# Patient Record
Sex: Male | Born: 1960 | Hispanic: Yes | Marital: Married | Smoking: Former smoker
Health system: Southern US, Community
[De-identification: ages and names within clinical notes are randomized; demographics above are authoritative.]

## PROBLEM LIST (undated history)

## (undated) DIAGNOSIS — G510 Bell's palsy: Secondary | ICD-10-CM

## (undated) DIAGNOSIS — K219 Gastro-esophageal reflux disease without esophagitis: Secondary | ICD-10-CM

## (undated) HISTORY — DX: Gastro-esophageal reflux disease without esophagitis: K21.9

## (undated) HISTORY — PX: OTHER SURGICAL HISTORY: SHX169

---

## 2012-01-18 ENCOUNTER — Encounter: Payer: Self-pay | Admitting: Internal Medicine

## 2012-02-15 ENCOUNTER — Ambulatory Visit (AMBULATORY_SURGERY_CENTER): Payer: BC Managed Care – PPO | Admitting: *Deleted

## 2012-02-15 VITALS — Ht 64.0 in | Wt 170.6 lb

## 2012-02-15 DIAGNOSIS — Z1211 Encounter for screening for malignant neoplasm of colon: Secondary | ICD-10-CM

## 2012-02-15 MED ORDER — MOVIPREP 100 G PO SOLR: ORAL | Status: DC

## 2012-03-03 ENCOUNTER — Ambulatory Visit (AMBULATORY_SURGERY_CENTER): Payer: BC Managed Care – PPO | Admitting: Internal Medicine

## 2012-03-03 ENCOUNTER — Encounter: Payer: Self-pay | Admitting: Internal Medicine

## 2012-03-03 VITALS — BP 108/71 | HR 57 | Temp 96.7°F | Resp 16 | Ht 64.0 in | Wt 170.0 lb

## 2012-03-03 DIAGNOSIS — K5730 Diverticulosis of large intestine without perforation or abscess without bleeding: Secondary | ICD-10-CM

## 2012-03-03 DIAGNOSIS — Z1211 Encounter for screening for malignant neoplasm of colon: Secondary | ICD-10-CM

## 2012-03-03 MED ORDER — SODIUM CHLORIDE 0.9 % IV SOLN: 500.0000 mL | INTRAVENOUS | Status: DC

## 2012-03-03 NOTE — Patient Instructions (Addendum)
YOU HAD AN ENDOSCOPIC PROCEDURE TODAY AT THE [facility]: Refer to the procedure report that was given to you for any specific questions about what was found during the examination.  If the procedure report does not answer your questions, please call your gastroenterologist to clarify.  If you requested that your care partner not be given the details of your procedure findings, then the procedure report has been included in a sealed envelope for you to review at your convenience later.  YOU SHOULD EXPECT: Some feelings of bloating in the abdomen. Passage of more gas than usual.  Walking can help get rid of the air that was put into your GI tract during the procedure and reduce the bloating. If you had a lower endoscopy (such as a colonoscopy or flexible sigmoidoscopy) you may notice spotting of blood in your stool or on the toilet paper. If you underwent a bowel prep for your procedure, then you may not have a normal bowel movement for a few days.  DIET: Your first meal following the procedure should be a light meal and then it is ok to progress to your normal diet.  A half-sandwich or bowl of soup is an example of a good first meal.  Heavy or fried foods are harder to digest and may make you feel nauseous or bloated.  Likewise meals heavy in dairy and vegetables can cause extra gas to form and this can also increase the bloating.  Drink plenty of fluids but you should avoid alcoholic beverages for 24 hours.  ACTIVITY: Your care partner should take you home directly after the procedure.  You should plan to take it easy, moving slowly for the rest of the day.  You can resume normal activity the day after the procedure however you should NOT DRIVE or use heavy machinery for 24 hours (because of the sedation medicines used during the test).    SYMPTOMS TO REPORT IMMEDIATELY: A gastroenterologist can be reached at any hour.  During normal business hours, 8:30 AM to 5:00 PM Monday through Friday,  call [phone].  After hours and on weekends, please call the GI answering service at [phone] who will take a message and have the physician on call contact you.   Following lower endoscopy (colonoscopy or flexible sigmoidoscopy):  Excessive amounts of blood in the stool  Significant tenderness or worsening of abdominal pains  Swelling of the abdomen that is new, acute  Fever of 100F or higher  FOLLOW UP: If any biopsies were taken you will be contacted by phone or by letter within the next 1-3 weeks.  Call your gastroenterologist if you have not heard about the biopsies in 3 weeks.  Our staff will call the home number listed on your records the next business day following your procedure to check on you and address any questions or concerns that you may have at that time regarding the information given to you following your procedure. This is a courtesy call and so if there is no answer at the home number and we have not heard from you through the emergency physician on call, we will assume that you have returned to your regular daily activities without incident.  SIGNATURES/CONFIDENTIALITY: You and/or your care partner have signed paperwork which will be entered into your electronic medical record.  These signatures attest to the fact that that the information above on your After Visit Summary has been reviewed and is understood.  Full responsibility of the confidentiality of this   discharge information lies with you and/or your care-partner.   Diverticulosis-handout given  High Fiber Diet-handout given  Repeat colonoscopy in 10 years  Diverticulosis (Diverticulosis)  La diverticulosis es una enfermedad frecuente que aparece cuando se forman pequeas bolsas (divertculos) en las paredes del colon. El riesgo aumenta con la edad. Ocurre con ms frecuencia en aquellas personas que se alimentan con una dieta pobre en fibras. La [employer] de los individuos que sufren este trastorno no  tiene sntomas. Las personas que tienen sntomas generalmente experimentan dolor abdominal, constipacin o heces flojas (diarrea). INSTRUCCIONES PARA EL CUIDADO DOMICILIARIO  Aumento en la cantidad de fibra en la dieta, segn las indicaciones del mdico o nutricionista. Esto puede reducir los sntomas de diverticulosis.  El mdico podr indicarle que tome un suplemento dietario con fibras.  Beba entre 6 y 8 vasos de agua por da para Associate Professor.  Trate de no hacer fuerza al mover el intestino.  El mdico podr recomendarle que no comer nueces y semillas para evitar complicaciones, aunque an no se conoce si esto produce algn beneficio.  Solo tome medicamentos que se pueden comprar sin receta o recetados para Chief Technology Officer, Dentist o fiebre, como le indica el mdico. LOS ALIMENTOS CON ALTO CONTENIDO DE FIBRA SON:  Frutas: Luna Kitchens, durazno, pera, mandarina, pasas de uva, ciruelas.  Vegetales: Repollitos de Bruselas, esprragos, brcoli, calabaza, zanahoria, coliflor, lechuga romana, espinaca, tomates, zapallo, zucchini.  Vegetales que contienen hidratos de carbono: Porotos, frijoles, habas, arvejas partidas, lentejas, patatas (con piel).  Granos: Pan de salvado, arroz marrn, copos de cereal, harina de avena, arroz blanco, bollitos de salvado. SOLICITE ATENCIN MDICA INMEDIATAMENTE SI:  El dolor o la hinchazn Duncan.  La temperatura oral le sube a ms de 102 F (38.9 C) y no puede bajarla con medicamentos.  Los vmitos o la materia fecal contienen sangre o son negros. Document Released: 01/01/2008 Document Revised: 04/12/2011  [facility] Patient Information 2013 [place], [place].  Dieta con alto contenido de fibra  (High [employer] Diet) La fibra se encuentra en frutas, verduras y granos. Una dieta con alto contenido en fibras se favorece con la adicin de ms granos enteros, legumbres, frutas y verduras en su dieta. La cantidad recomendada de fibra para los hombres adultos es  de 38 g por da. Para las mujeres adultas es de 25 g por da. Las [employer] y las que amamantan deben consumir 28 gramos de fibra por Futures trader. Si usted tiene un problema digestivo o intestinal, consulte a su mdico antes de la adicin de alimentos ricos en fibra a su dieta. Coma una variedad de alimentos ricos en fibra en lugar de slo unos pocos.  OBJETIVO   Aumentar la masa fecal.  Tener deposiciones ms regulares para evitar el estreimiento.  Reducir el colesterol.  Para evitar comer en exceso. CUANDO SE UTILIZA ESTA DIETA?   En caso de estreimiento y hemorroides.  En caso de diverticulosis no complicada (enfermedad intestinal) y en el sndrome del colon irritable.  Si necesita ayuda para el control de [place].  Si desea mejorar su dieta como medida de proteccin contra la aterosclerosis, la diabetes y Management consultant. FUENTES DE FIBRA   Panes y cereales integrales.  Frutas, como las [place], [place], pltanos, fresas, Environmental manager y peras.  Verduras, como guisantes, zanahorias, batatas, remolachas, brcoli, repollo, espinacas y alcauciles.  Legumbres, las arvejas, soja, lentejas.  Almendras. CONTENIDO DE FIBRA DE LOS ALIMENTOS  Almidones y granos / Fibra Diettica (g)   Cheerios, 1 taza / 3 g  Corn Flakes, 1 taza / 0,7 g  Arroz inflado, 1  tazas / 0,3 g  Harina de avena instantnea (cocida),  taza / 2 g  Cereal de trigo escarchado, 1 taza / 5,1 g  Arroz marrn grano largo (cocido), 1 taza / 3,5 g  Arroz blanco grano largo (cocido), 1 taza / 0,6 g  Macarrones enriquecidos (cocidos), 1 taza / 2,5 g Legumbres / Fibra Diettica (g)   Frijoles cocidos (enlatados, crudos o vegetarianos),  taza / 5,2 g  Frijoles (enlatados),  taza / 6,8 g  Frijoles pintos (cocidos),  taza / 5,5 g Panes y Gaffer / Media planner (g)   Galletas de graham o miel, 2 plazas / 0,7 g  Galletitas saladas, 3 unidades / 0,3 g  Pretzels salados comunes, 10 pedazos / 1,8 g  Pan  integral, 1 rebanada / 1,9 g  Pan blanco, 1 rebanada / 0,7 g  Pan con pasas, 1 rebanada / 1,2 g  Bagel 3 oz / 2 g  Tortilla de harina, 1 oz / 0.9 g  Tortilla de maz, 1 pequea / 1,5 g  Pan de amburguesa o hot dog, 1 pequeo / 0,9 g Frutas / Fibra Diettica (g)   Manzana con piel, 1 mediana / 4,4 g  Pur de [employer],  taza / 1,5 g  Pltano,  mediano / 1,5 g  Uvas, 10 uvas / 0,4 g  Naranja, 1 pequea / 2,3 g  Pasas, 1,5 oz / 1.6 g  Meln, 1 taza / 1,4 g Vegetales / Fibra Diettica (g)   Judas verdes (en conserva),  taza / 1,3 g  Zanahorias (cocido),  taza / 2,3 g  Broccoli (cocido),  taza / 2,8 g  Guisantes (cocidos),  taza / 4,4 g  Pur de papas,  taza / 1,6 g  Lechuga, 1 taza / 0,5 g  Maz (en lata),  taza / 1,6 g  Tomate,  taza / 1,1 g 1 cup / 3 g. Document Released: 01/18/2005 Document Revised: 07/20/2011 [facility] Patient Information 2013 [place], [place].

## 2012-03-03 NOTE — Progress Notes (Signed)
0844 In Pacu, vsss, bbs=clear.

## 2012-03-03 NOTE — Op Note (Signed)
[facility] [address] [place], [address]   COLONOSCOPY PROCEDURE REPORT  PATIENT: Jay Wyatt, Jay Wyatt  MR#: [id number] BIRTHDATE: [birth year] , 51  yrs. old GENDER: Male ENDOSCOPIST: Roxy Cedar, MD REFERRED XB:JYNWGN Christell Constant, M.D. PROCEDURE DATE:  03/03/2012 PROCEDURE:   Colonoscopy, screening ASA CLASS:   Class I INDICATIONS:Average risk patient for colon cancer. MEDICATIONS: MAC sedation, administered by CRNA and propofol (Diprivan) 200mg  IV  DESCRIPTION OF PROCEDURE:   After the risks benefits and alternatives of the procedure were thoroughly explained, informed consent was obtained.  A digital rectal exam revealed no abnormalities of the rectum.   The LB CF-[id number]  endoscope was introduced through the anus and advanced to the cecum, which was identified by both the appendix and ileocecal valve. No adverse events experienced.   The quality of the prep was excellent, using MoviPrep  The instrument was then slowly withdrawn as the colon was fully examined.      COLON FINDINGS: Moderate diverticulosis was noted in the ascending colon and sigmoid colon.   The colon was otherwise normal.  There was no inflammation, polyps or cancers.  Retroflexed views revealed no abnormalities. The time to cecum=2 minutes 34 seconds. Withdrawal time=8 minutes 18 seconds.  The scope was withdrawn and the procedure completed.  COMPLICATIONS: There were no complications.  ENDOSCOPIC IMPRESSION: 1.   Moderate diverticulosis was noted in the ascending colon and sigmoid colon 2.   The colon was otherwise normal  RECOMMENDATIONS: 1. Continue current colorectal screening recommendations for "routine risk" patients with a repeat colonoscopy in 10 years.   eSigned:  Roxy Cedar, MD 03/03/2012 8:39 AM   cc: Rudi Heap, MD and The Patient

## 2012-03-03 NOTE — Progress Notes (Signed)
Patient did not experience any of the following events: a burn prior to discharge; a fall within the facility; wrong site/side/patient/procedure/implant event; or a hospital transfer or hospital admission upon discharge from the facility. (G8907) Patient did not have preoperative order for IV antibiotic SSI prophylaxis. (G8918)  

## 2012-03-06 ENCOUNTER — Telehealth: Payer: Self-pay

## 2012-03-06 NOTE — Telephone Encounter (Signed)
  Follow up Call-  Call back number 03/03/2012  Post procedure Call Back phone  # [phone]  Permission to leave phone message Yes     Patient questions:  Do you have a fever, pain , or abdominal swelling? no Pain Score  0 *  Have you tolerated food without any problems? yes  Have you been able to return to your normal activities? yes  Do you have any questions about your discharge instructions: Diet   no Medications  no Follow up visit  no  Do you have questions or concerns about your Care? no  Actions: * If pain score is 4 or above: No action needed, pain <4.

## 2013-04-30 ENCOUNTER — Emergency Department (HOSPITAL_COMMUNITY)
Admission: EM | Admit: 2013-04-30 | Discharge: 2013-04-30 | Disposition: A | Discharge status: Home / Self Care | Payer: BC Managed Care – PPO | Attending: Emergency Medicine | Admitting: Emergency Medicine

## 2013-04-30 ENCOUNTER — Encounter (HOSPITAL_COMMUNITY): Payer: Self-pay | Admitting: Emergency Medicine

## 2013-04-30 DIAGNOSIS — K219 Gastro-esophageal reflux disease without esophagitis: Secondary | ICD-10-CM | POA: Insufficient documentation

## 2013-04-30 DIAGNOSIS — K029 Dental caries, unspecified: Secondary | ICD-10-CM | POA: Insufficient documentation

## 2013-04-30 DIAGNOSIS — Z87891 Personal history of nicotine dependence: Secondary | ICD-10-CM | POA: Insufficient documentation

## 2013-04-30 DIAGNOSIS — G510 Bell's palsy: Secondary | ICD-10-CM | POA: Insufficient documentation

## 2013-04-30 DIAGNOSIS — Z79899 Other long term (current) drug therapy: Secondary | ICD-10-CM | POA: Insufficient documentation

## 2013-04-30 DIAGNOSIS — R5383 Other fatigue: Secondary | ICD-10-CM

## 2013-04-30 DIAGNOSIS — R5381 Other malaise: Secondary | ICD-10-CM | POA: Insufficient documentation

## 2013-04-30 MED ORDER — ERYTHROMYCIN 5 MG/GM OP OINT
TOPICAL_OINTMENT | Freq: Every day | OPHTHALMIC | Status: DC
  Administered 2013-04-30: 13:00:00 via OPHTHALMIC
  Filled 2013-04-30: qty 1

## 2013-04-30 MED ORDER — PENICILLIN V POTASSIUM 250 MG PO TABS: 250.0000 mg | ORAL_TABLET | Freq: Four times a day (QID) | ORAL | Status: AC

## 2013-04-30 NOTE — ED Notes (Signed)
Pt updated on delay; encouraged to stay.

## 2013-04-30 NOTE — ED Notes (Signed)
Pt has left side facial droop that began last Sunday. He received treatment at another facility last Tuesday and was diagnosed with Bell's Palsy. He has begun a round of medications to treat the symptoms but has noticed no improvement so he came in to follow up.

## 2013-04-30 NOTE — ED Provider Notes (Addendum)
CSN: [id number]     Arrival date & time 04/30/13  1002 History   First MD Initiated Contact with Patient 04/30/13 1212     Chief Complaint  Patient presents with  . Facial Droop     (Consider location/radiation/quality/duration/timing/severity/associated sxs/prior Treatment) HPI Comments: [facility] after 2 days of left-sided facial droop. At that time he had a CAT scan of his head and blood work done which was all normal and he was diagnosed with Bell's palsy. He returned today because the symptoms are not improving despite taking medicine for the last 6 days. He also saw an eye doctor and was given eyedrops to keep his eye moist. He denies any extremity numbness or weakness. No speech problems but he does complain of food falling out of his mouth when he is trying to eat.  Patient is a 53 y.o. male presenting with weakness. The history is provided by the patient.  Weakness This is a new problem. Episode onset: 1 week. The problem occurs constantly. The problem has not changed since onset.Pertinent negatives include no headaches. Associated symptoms comments: Left sided facial droop. Nothing aggravates the symptoms. Nothing relieves the symptoms. Treatments tried: Valacyclovir and prednisone. The treatment provided no relief.    Past Medical History  Diagnosis Date  . GERD (gastroesophageal reflux disease)    Past Surgical History  Procedure Laterality Date  . No prior surgery     Family History  Problem Relation Age of Onset  . Colon cancer Neg Hx   . Stomach cancer Neg Hx   . Diabetes Mother   . Diabetes Father    History  Substance Use Topics  . Smoking status: Former Games developermoker  . Smokeless tobacco: Never Used  . Alcohol Use: 3.6 oz/week    6 Cans of beer per week    Review of Systems  Neurological: Positive for facial asymmetry and weakness. Negative for speech difficulty and headaches.  All other systems reviewed and are  negative.      Allergies  Review of patient's allergies indicates no known allergies.  Home Medications   Current Outpatient Rx  Name  Route  Sig  Dispense  Refill  . omeprazole (PRILOSEC) 20 MG capsule   Oral   Take 20 mg by mouth daily.          BP 129/79  Pulse 65  Temp(Src) 97.9 F (36.6 C) (Oral)  Resp 20  Ht 5\' 4"  (1.626 m)  Wt 181 lb 1.6 oz (82.146 kg)  BMI 31.07 kg/m2 Physical Exam  Nursing note and vitals reviewed. Constitutional: He is oriented to person, place, and time. He appears well-developed and well-nourished. No distress.  HENT:  Head: Normocephalic and atraumatic.  Mouth/Throat: Oropharynx is clear and moist. Dental caries present.    Eyes: Conjunctivae and EOM are normal. Pupils are equal, round, and reactive to light.  Neck: Normal range of motion. Neck supple.  Cardiovascular: Normal rate, regular rhythm and intact distal pulses.   No murmur heard. Pulmonary/Chest: Effort normal and breath sounds normal. No respiratory distress. He has no wheezes. He has no rales.  Abdominal: Soft. He exhibits no distension. There is no tenderness. There is no rebound and no guarding.  Musculoskeletal: Normal range of motion. He exhibits no edema and no tenderness.  Neurological: He is alert and oriented to person, place, and time. A cranial nerve deficit is present.  Left facial droop without forehead sparing.  Unable to raise eyebrow.  Mild decreased sensation on the left side of face compared to right.  5/5 motor strength in all 4 extremities. Normal sensation in all 4 extremities. Speech normal. No visual field cut  Skin: Skin is warm and dry. No rash noted. No erythema.  Psychiatric: He has a normal mood and affect. His behavior is normal.    ED Course  Procedures (including critical care time) Labs Review Labs Reviewed - No data to display Imaging Review No results found.   EKG Interpretation   Date/Time:  Monday April 30 2013 10:15:49  EDT Ventricular Rate:  63 PR Interval:  158 QRS Duration: 100 QT Interval:  400 QTC Calculation: 409 R Axis:   15 Text Interpretation:  Normal sinus rhythm Incomplete right bundle branch  block Minimal voltage criteria for LVH, may be normal variant Nonspecific  ST abnormality No previous tracing Confirmed by Anitra Lauth  MD, Alphonzo Lemmings  [phone]) on 04/30/2013 12:11:04 PM      MDM   Final diagnoses:  Bell's palsy  Dental caries    Patient here with evidence of S. palsy. He was diagnosed with Bell's palsy on [facility] he was given valacyclovir and prednisone which he has been taking aspirin scribe. [facility] he had a CT of his head MRI were done which were all within normal limits. Patient is having persistent symptoms but no new symptoms of extremity weakness, visual or or speech problems. It still appears to be Bell's palsy. Discussed with patient the expected time frame of symptoms and the need for followup. He already has eyedrops but does not have the staff to place at night. He is wearing protective eye gear because he is unable to completely close his lid.  Also patient has tooth decay and is scheduled to have a dental extraction on April 11. No apparent sign of gross infection at this time however will get a prescription for an antibiotic that he can start a week prior to his tooth being pulled.    Gwyneth Sprout, MD 04/30/13 1246  Gwyneth Sprout, MD 04/30/13 1247  Gwyneth Sprout, MD 04/30/13 1256

## 2013-04-30 NOTE — ED Notes (Signed)
[facility] and dx with Bells Palsy on the 24th; has not gotten better, droop seems to have gotten worse. Here bc worsening droop he thinks due to tooth pain. Recently saw eye doctor and was given drops for dry eye.

## 2013-12-22 ENCOUNTER — Encounter (HOSPITAL_COMMUNITY): Payer: Self-pay | Admitting: Emergency Medicine

## 2013-12-22 ENCOUNTER — Emergency Department (HOSPITAL_COMMUNITY)
Admission: EM | Admit: 2013-12-22 | Discharge: 2013-12-22 | Disposition: A | Discharge status: Home / Self Care | Payer: BC Managed Care – PPO | Attending: Emergency Medicine | Admitting: Emergency Medicine

## 2013-12-22 DIAGNOSIS — M5431 Sciatica, right side: Secondary | ICD-10-CM | POA: Insufficient documentation

## 2013-12-22 DIAGNOSIS — M722 Plantar fascial fibromatosis: Secondary | ICD-10-CM | POA: Insufficient documentation

## 2013-12-22 DIAGNOSIS — Z87891 Personal history of nicotine dependence: Secondary | ICD-10-CM | POA: Insufficient documentation

## 2013-12-22 DIAGNOSIS — Z8719 Personal history of other diseases of the digestive system: Secondary | ICD-10-CM | POA: Insufficient documentation

## 2013-12-22 LAB — CBG MONITORING, ED: Glucose-Capillary: 86 mg/dL (ref 70–99)

## 2013-12-22 MED ORDER — CYCLOBENZAPRINE HCL 10 MG PO TABS: 10.0000 mg | ORAL_TABLET | Freq: Three times a day (TID) | ORAL | Status: DC | PRN

## 2013-12-22 MED ORDER — PREDNISONE 10 MG PO TABS: ORAL_TABLET | ORAL | Status: DC

## 2013-12-22 MED ORDER — OXYCODONE-ACETAMINOPHEN 5-325 MG PO TABS: 1.0000 | ORAL_TABLET | ORAL | Status: AC | PRN

## 2013-12-22 NOTE — Progress Notes (Signed)
ED/CM noted patient did not have health insurance and/or PCP listed in the computer.  Patient was given the [place] resources handout with information on the clinics, food pantries, and the handout for new health insurance sign-up.  Also provided drug discount card.  Patient expressed appreciation for information received.   

## 2013-12-22 NOTE — Discharge Instructions (Signed)
Plantar Fasciitis Plantar fasciitis is a common condition that causes foot pain. It is soreness (inflammation) of the band of tough fibrous tissue on the bottom of the foot that runs from the heel bone (calcaneus) to the ball of the foot. The cause of this soreness may be from excessive standing, poor fitting shoes, running on hard surfaces, being overweight, having an abnormal walk, or overuse (this is common in runners) of the painful foot or feet. It is also common in aerobic exercise dancers and ballet dancers. SYMPTOMS  Most people with plantar fasciitis complain of:  Severe pain in the morning on the bottom of their foot especially when taking the first steps out of bed. This pain recedes after a few minutes of walking.  Severe pain is experienced also during walking following a long period of inactivity.  Pain is worse when walking barefoot or up stairs DIAGNOSIS   Your caregiver will diagnose this condition by examining and feeling your foot.  Special tests such as X-rays of your foot, are usually not needed. PREVENTION   Consult a sports medicine professional before beginning a new exercise program.  Walking programs offer a good workout. With walking there is a lower chance of overuse injuries common to runners. There is less impact and less jarring of the joints.  Begin all new exercise programs slowly. If problems or pain develop, decrease the amount of time or distance until you are at a comfortable level.  Wear good shoes and replace them regularly.  Stretch your foot and the heel cords at the back of the ankle (Achilles tendon) both before and after exercise.  Run or exercise on even surfaces that are not hard. For example, asphalt is better than pavement.  Do not run barefoot on hard surfaces.  If using a treadmill, vary the incline.  Do not continue to workout if you have foot or joint problems. Seek professional help if they do not improve. HOME CARE INSTRUCTIONS     Avoid activities that cause you pain until you recover.  Use ice or cold packs on the problem or painful areas after working out.  Only take over-the-counter or prescription medicines for pain, discomfort, or fever as directed by your caregiver.  Soft shoe inserts or athletic shoes with air or gel sole cushions may be helpful.  If problems continue or become more severe, consult a sports medicine caregiver or your own health care provider. Cortisone is a potent anti-inflammatory medication that may be injected into the painful area. You can discuss this treatment with your caregiver. MAKE SURE YOU:   Understand these instructions.  Will watch your condition.  Will get help right away if you are not doing well or get worse. Document Released: 10/13/2000 Document Revised: 04/12/2011 Document Reviewed: 12/13/2007 [facility] Patient Information 2015 [place], [place]. This information is not intended to replace advice given to you by your health care provider. Make sure you discuss any questions you have with your health care provider.  Sciatica Sciatica is pain, weakness, numbness, or tingling along your sciatic nerve. The nerve starts in the lower back and runs down the back of each leg. Nerve damage or certain conditions pinch or put pressure on the sciatic nerve. This causes the pain, weakness, and other discomforts of sciatica. HOME CARE   Only take medicine as told by your doctor.  Apply ice to the affected area for 20 minutes. Do this 3-4 times a day for the first 48-72 hours. Then try heat in the  same way.  Exercise, stretch, or do your usual activities if these do not make your pain worse.  Go to physical therapy as told by your doctor.  Keep all doctor visits as told.  Do not wear high heels or shoes that are not supportive.  Get a firm mattress if your mattress is too soft to lessen pain and discomfort. GET HELP RIGHT AWAY IF:   You cannot control when you poop (bowel  movement) or pee (urinate).  You have more weakness in your lower back, lower belly (pelvis), butt (buttocks), or legs.  You have redness or puffiness (swelling) of your back.  You have a burning feeling when you pee.  You have pain that gets worse when you lie down.  You have pain that wakes you from your sleep.  Your pain is worse than past pain.  Your pain lasts longer than 4 weeks.  You are suddenly losing weight without reason. MAKE SURE YOU:   Understand these instructions.  Will watch this condition.  Will get help right away if you are not doing well or get worse. Document Released: 10/28/2007 Document Revised: 07/20/2011 Document Reviewed: 05/30/2011 [facility] Patient Information 2015 [place], [place]. This information is not intended to replace advice given to you by your health care provider. Make sure you discuss any questions you have with your health care provider.

## 2013-12-22 NOTE — ED Provider Notes (Signed)
CSN: [id number]     Arrival date & time 12/22/13  0945 History   First MD Initiated Contact with Patient 12/22/13 1019     Chief Complaint  Patient presents with  . Back Pain     (Consider location/radiation/quality/duration/timing/severity/associated sxs/prior Treatment) HPI  Jay Wyatt is a 53 y.o. male who presents to the Emergency Department complaining of right sided low back pain for 2 weeks.  He describes the pain as sharp and burning from his right low back and radiates into the right buttock and down to the level of the knee.  Pain is worse with movement and twisting and improves at rest.  He also reports pain and tingling to his hind foot and ankle for 3-4 days.  Pain is worse when first putting weight to his foot the morning.  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   He denies abd pain, incontinence of bladder or bowel, dysuria, saddle anesthesia's, numbness or weakness of the LE's.     Past Medical History  Diagnosis Date  . GERD (gastroesophageal reflux disease)    Past Surgical History  Procedure Laterality Date  . No prior surgery     Family History  Problem Relation Age of Onset  . Colon cancer Neg Hx   . Stomach cancer Neg Hx   . Diabetes Mother   . Diabetes Father    History  Substance Use Topics  . Smoking status: Former Games developermoker  . Smokeless tobacco: Never Used  . Alcohol Use: 3.6 oz/week    6 Cans of beer per week    Review of Systems  Constitutional: Negative for fever.  Respiratory: Negative for shortness of breath.   Gastrointestinal: Negative for vomiting, abdominal pain and constipation.  Genitourinary: Negative for dysuria, hematuria, flank pain, decreased urine volume and difficulty urinating.       Low back pain  Musculoskeletal: Positive for back pain. Negative for joint swelling, neck pain and neck stiffness.  Skin: Negative for rash.  Neurological: Negative  for dizziness, weakness and numbness.  All other systems reviewed and are negative.     Allergies  Review of patient's allergies indicates no known allergies.  Home Medications   Prior to Admission medications   Medication Sig Start Date End Date Taking? Authorizing Provider  acetaminophen (TYLENOL) 500 MG tablet Take 1,000 mg by mouth every 6 (six) hours as needed for moderate pain.   Yes Historical Provider, MD   BP 123/83 mmHg  Pulse 62  Temp(Src) 98.1 F (36.7 C) (Oral)  Resp 18  Ht 5\' 6"  (1.676 m)  Wt 184 lb (83.462 kg)  BMI 29.71 kg/m2  SpO2 96% Physical Exam  Constitutional: He is oriented to person, place, and time. He appears well-developed and well-nourished. No distress.  HENT:  Head: Normocephalic and atraumatic.  Neck: Normal range of motion. Neck supple.  Cardiovascular: Normal rate, regular rhythm, normal heart sounds and intact distal pulses.   No murmur heard. Pulmonary/Chest: Effort normal and breath sounds normal. No respiratory distress. He exhibits no tenderness.  Abdominal: Soft. He exhibits no distension. There is no tenderness. There is no rebound and no guarding.  Musculoskeletal: He exhibits tenderness. He exhibits no edema.       Lumbar back: He exhibits tenderness and pain. He exhibits normal range of motion, no swelling, no deformity, no laceration and normal pulse.  ttp of the right lumbar paraspinal muscles and  SI joint.  No spinal tenderness.  DP pulses are brisk and symmetrical.  Distal sensation intact.  Hip Flexors/Extensors are intact.  Pt has 5/5 strength against resistance of bilateral lower extremities.  Tenderness of the right instep of the foot.  No edema or erythema   Neurological: He is alert and oriented to person, place, and time. He has normal strength. No sensory deficit. He exhibits normal muscle tone. Coordination and gait normal.  Reflex Scores:      Patellar reflexes are 2+ on the right side and 2+ on the left side.       Achilles reflexes are 2+ on the right side and 2+ on the left side. Skin: Skin is warm and dry. No rash noted.  Nursing note and vitals reviewed.   ED Course  Procedures (including critical care time) Labs Review Labs Reviewed  CBG MONITORING, ED    Imaging Review No results found.   EKG Interpretation None      MDM   Final diagnoses:  Sciatica, right  Plantar fasciitis of right foot    Pt is ambulatory with steady giat, no focal neuro deficits.   No concerning sx's for emergent neurological or infectious process.  Sx's likely related to sciatica and probable plantar fasciitis, secondary to standing on ladders.  Pt agrees to symptomatic tx with prednisone, percocet, and flexeril.  He agrees to PMD f/u or return here if the sx's worsen.  He appears stable for d/c.      Zalika Tieszen L. Trisha Mangleriplett, PA-C 12/23/13 [id number]  Shon Batonourtney F Horton, MD 12/23/13 1109

## 2013-12-22 NOTE — ED Notes (Signed)
Pt reports right low back pain that radiates down his right leg, described as burning pain

## 2014-02-04 ENCOUNTER — Encounter (HOSPITAL_COMMUNITY): Payer: Self-pay

## 2014-02-04 ENCOUNTER — Emergency Department (HOSPITAL_COMMUNITY)
Admission: EM | Admit: 2014-02-04 | Discharge: 2014-02-04 | Disposition: A | Discharge status: Home / Self Care | Payer: Self-pay | Attending: Emergency Medicine | Admitting: Emergency Medicine

## 2014-02-04 DIAGNOSIS — G510 Bell's palsy: Secondary | ICD-10-CM | POA: Insufficient documentation

## 2014-02-04 DIAGNOSIS — Z7952 Long term (current) use of systemic steroids: Secondary | ICD-10-CM | POA: Insufficient documentation

## 2014-02-04 DIAGNOSIS — Z79899 Other long term (current) drug therapy: Secondary | ICD-10-CM | POA: Insufficient documentation

## 2014-02-04 DIAGNOSIS — Z8719 Personal history of other diseases of the digestive system: Secondary | ICD-10-CM | POA: Insufficient documentation

## 2014-02-04 DIAGNOSIS — Z87891 Personal history of nicotine dependence: Secondary | ICD-10-CM | POA: Insufficient documentation

## 2014-02-04 HISTORY — DX: Bell's palsy: G51.0

## 2014-02-04 MED ORDER — VALACYCLOVIR HCL 1 G PO TABS: 1000.0000 mg | ORAL_TABLET | Freq: Three times a day (TID) | ORAL | Status: AC

## 2014-02-04 MED ORDER — PREDNISONE 20 MG PO TABS: 60.0000 mg | ORAL_TABLET | Freq: Every day | ORAL | Status: AC

## 2014-02-04 NOTE — Discharge Instructions (Signed)
Bell's Palsy °Bell's palsy is a condition in which the muscles on one side of the face cannot move (paralysis). This is because the nerves in the face are paralyzed. It is most often thought to be caused by a virus. The virus causes swelling of the nerve that controls movement on one side of the face. The nerve travels through a tight space surrounded by bone. When the nerve swells, it can be compressed by the bone. This results in damage to the protective covering around the nerve. This damage interferes with how the nerve communicates with the muscles of the face. As a result, it can cause weakness or paralysis of the facial muscles.  °Injury (trauma), tumor, and surgery may cause Bell's palsy, but most of the time the cause is unknown. It is a relatively common condition. It starts suddenly (abrupt onset) with the paralysis usually ending within 2 days. Bell's palsy is not dangerous. But because the eye does not close properly, you may need care to keep the eye from getting dry. This can include splinting (to keep the eye shut) or moistening with artificial tears. Bell's palsy very seldom occurs on both sides of the face at the same time. °SYMPTOMS  °· Eyebrow sagging. °· Drooping of the eyelid and corner of the mouth. °· Inability to close one eye. °· Loss of taste on the front of the tongue. °· Sensitivity to loud noises. °TREATMENT  °The treatment is usually non-surgical. If the patient is seen within the first 24 to 48 hours, a short course of steroids may be prescribed, in an attempt to shorten the length of the condition. Antiviral medicines may also be used with the steroids, but it is unclear if they are helpful.  °You will need to protect your eye, if you cannot close it. The cornea (clear covering over your eye) will become dry and can be damaged. Artificial tears can be used to keep your eye moist. Glasses or an eye patch should be worn to protect your eye. °PROGNOSIS  °Recovery is variable, ranging  from days to months. Although the problem usually goes away completely (about 80% of cases resolve), predicting the outcome is impossible. Most people improve within 3 weeks of when the symptoms began. Improvement may continue for 3 to 6 months. A small number of people have moderate to severe weakness that is permanent.  °HOME CARE INSTRUCTIONS  °· If your caregiver prescribed medication to reduce swelling in the nerve, use as directed. Do not stop taking the medication unless directed by your caregiver. °· Use moisturizing eye drops as needed to prevent drying of your eye, as directed by your caregiver. °· Protect your eye, as directed by your caregiver. °· Use facial massage and exercises, as directed by your caregiver. °· Perform your normal activities, and get your normal rest. °SEEK IMMEDIATE MEDICAL CARE IF:  °· There is pain, redness or irritation in the eye. °· You or your child has an oral temperature above 102° F (38.9° C), not controlled by medicine. °MAKE SURE YOU:  °· Understand these instructions. °· Will watch your condition. °· Will get help right away if you are not doing well or get worse. °Document Released: 01/18/2005 Document Revised: 04/12/2011 Document Reviewed: 04/27/2013 °Exit[employer]® Patient Information ©2015 [employer]. This information is not intended to replace advice given to you by your health care provider. Make sure you discuss any questions you have with your health care provider. ° °

## 2014-02-04 NOTE — Care Management Note (Signed)
ED/CM noted patient did not have health insurance and/or PCP listed in the computer.  Patient was given the [place] resource handout with information on the clinics, food pantries, and the handout for new health insurance sign-up. Pt was also given a Rx discount card. Patient expressed appreciation for information received. 

## 2014-02-04 NOTE — ED Notes (Signed)
EDP at bedside  

## 2014-02-04 NOTE — ED Provider Notes (Signed)
CSN: [id number]     Arrival date & time 02/04/14  1045 History   First MD Initiated Contact with Patient 02/04/14 1159     Chief Complaint  Patient presents with  . Facial Droop     (Consider location/radiation/quality/duration/timing/severity/associated sxs/prior Treatment) The history is provided by the patient.   patient presents with right-sided facial droop that began yesterday. Previous history of Bell's palsy around 7 months ago. States he will get yesterday with his face drooping. It is similar to what he had last time. States he has some mild ringing in his right ear. No other numbness or weakness. No headache. No confusion. No fevers. He states he just wants to be able go back to work.  Past Medical History  Diagnosis Date  . GERD (gastroesophageal reflux disease)   . Bell's palsy    Past Surgical History  Procedure Laterality Date  . No prior surgery     Family History  Problem Relation Age of Onset  . Colon cancer Neg Hx   . Stomach cancer Neg Hx   . Diabetes Mother   . Diabetes Father    History  Substance Use Topics  . Smoking status: Former Games developer  . Smokeless tobacco: Never Used  . Alcohol Use: 3.6 oz/week    6 Cans of beer per week     Comment: occ    Review of Systems  Constitutional: Negative for fatigue.  Respiratory: Negative for apnea.   Cardiovascular: Negative for chest pain.  Neurological: Positive for facial asymmetry. Negative for dizziness, syncope, weakness, numbness and headaches.  Psychiatric/Behavioral: Negative for confusion.      Allergies  Review of patient's allergies indicates no known allergies.  Home Medications   Prior to Admission medications   Medication Sig Start Date End Date Taking? Authorizing Provider  acetaminophen (TYLENOL) 500 MG tablet Take 1,000 mg by mouth every 6 (six) hours as needed for moderate pain.    Historical Provider, MD  cyclobenzaprine (FLEXERIL) 10 MG tablet Take 1 tablet (10 mg total) by mouth 3  (three) times daily as needed. Patient not taking: Reported on 02/04/2014 12/22/13   Tammy L. Triplett, PA-C  oxyCODONE-acetaminophen (PERCOCET/ROXICET) 5-325 MG per tablet Take 1 tablet by mouth every 4 (four) hours as needed. Patient not taking: Reported on 02/04/2014 12/22/13   Tammy L. Triplett, PA-C  predniSONE (DELTASONE) 20 MG tablet Take 3 tablets (60 mg total) by mouth daily. 02/04/14   Juliet Rude. Temisha Murley, MD  valACYclovir (VALTREX) 1000 MG tablet Take 1 tablet (1,000 mg total) by mouth 3 (three) times daily. 02/04/14 02/18/14  Juliet Rude. Martino Tompson, MD   BP 129/88 mmHg  Pulse 60  Temp(Src) 97.9 F (36.6 C) (Oral)  Resp 20  Ht  (1.626 m)  Wt 180 lb (81.647 kg)  BMI 30.88 kg/m2  SpO2 100% Physical Exam  Constitutional: He appears well-developed.  HENT:  Head: Normocephalic.  Right TM normal  Eyes: EOM are normal.  Neck: Neck supple.  Cardiovascular: Normal rate and regular rhythm.   Pulmonary/Chest: Effort normal.  Neurological: He is alert.  Moderate right-sided facial droop. Smile will not equalize even with effort. Some difficulty closing right eye. Minimal response from right eyebrow with attempted raising. Sensation intact.    ED Course  Procedures (including critical care time) Labs Review Labs Reviewed - No data to display  Imaging Review No results found.   EKG Interpretation None      MDM   Final diagnoses:  Bell's palsy  Patient with rather severe Bell's palsy. Began yesterday. Will treat with valacyclovir and steroids. Will discharge home.    Juliet Rude. Rubin Payor, MD 02/04/14 1233

## 2014-02-04 NOTE — ED Notes (Signed)
Pt reports r side of face numb and r sided facial droop since yesterday afternoon.  Reports was diagnosed with bells palsy 4 months ago and was given medication.  Pt says it got better then symptoms began last night.

## 2018-08-23 ENCOUNTER — Other Ambulatory Visit: Payer: Self-pay

## 2018-08-23 ENCOUNTER — Emergency Department (HOSPITAL_COMMUNITY)
Admission: EM | Admit: 2018-08-23 | Discharge: 2018-08-23 | Disposition: A | Discharge status: Home / Self Care | Payer: Self-pay | Attending: Emergency Medicine | Admitting: Emergency Medicine

## 2018-08-23 ENCOUNTER — Emergency Department (HOSPITAL_COMMUNITY): Payer: Self-pay

## 2018-08-23 ENCOUNTER — Encounter (HOSPITAL_COMMUNITY): Payer: Self-pay | Admitting: Emergency Medicine

## 2018-08-23 DIAGNOSIS — Z87891 Personal history of nicotine dependence: Secondary | ICD-10-CM | POA: Insufficient documentation

## 2018-08-23 DIAGNOSIS — M546 Pain in thoracic spine: Secondary | ICD-10-CM | POA: Insufficient documentation

## 2018-08-23 DIAGNOSIS — M549 Dorsalgia, unspecified: Secondary | ICD-10-CM

## 2018-08-23 MED ORDER — CYCLOBENZAPRINE HCL 10 MG PO TABS: 10.0000 mg | ORAL_TABLET | Freq: Three times a day (TID) | ORAL | 0 refills | Status: AC | PRN

## 2018-08-23 MED ORDER — NAPROXEN 500 MG PO TABS: 500.0000 mg | ORAL_TABLET | Freq: Two times a day (BID) | ORAL | 0 refills | Status: AC

## 2018-08-23 NOTE — Discharge Instructions (Signed)
Take the medication as directed.  Follow-up with your primary doctor for recheck.  Return to the ER for any worsening symptoms such as increasing pain, chest pain, or shortness of breath.

## 2018-08-23 NOTE — ED Triage Notes (Signed)
Patient reports upper back pain made worse with mvmt, especially when trying to turn in the bed or bend over. Works in [employer]. Denies chest pain, dizziness, nausea, or emesis.

## 2018-08-23 NOTE — ED Provider Notes (Signed)
[facility] EMERGENCY DEPARTMENT Provider Note   CSN: [id number] Arrival date & time: 08/23/18  1241     History   Chief Complaint Chief Complaint  Patient presents with  . Back Pain    HPI Jay Wyatt is a 58 y.o. male.     HPI   Jay Wyatt is a 58 y.o. male who presents to the Emergency Department complaining of upper back pain for 2 to 3 days.  He describes a throbbing pain to his upper back that mostly occurs with certain movements and when lying supine.  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  Pain does improve with over-the-counter pain relievers.  He denies chest pain, shortness of breath, pain, numbness, or weakness of his upper extremities, neck pain or headache.  He also complains of occasional cough, but denies fever, chills, sore throat or congestion.  Past Medical History:  Diagnosis Date  . Bell's palsy   . GERD (gastroesophageal reflux disease)     There are no active problems to display for this patient.   Past Surgical History:  Procedure Laterality Date  . no prior surgery          Home Medications    Prior to Admission medications   Medication Sig Start Date End Date Taking? Authorizing Provider  acetaminophen (TYLENOL) 500 MG tablet Take 1,000 mg by mouth every 6 (six) hours as needed for moderate pain.    [provider]  cyclobenzaprine (FLEXERIL) 10 MG tablet Take 1 tablet (10 mg total) by mouth 3 (three) times daily as needed. Patient not taking: Reported on 02/04/2014 12/22/13   Pauline Ausriplett, Kayin Kettering, PA-C  oxyCODONE-acetaminophen (PERCOCET/ROXICET) 5-325 MG per tablet Take 1 tablet by mouth every 4 (four) hours as needed. Patient not taking: Reported on 02/04/2014 12/22/13   Ivo Moga, PA-C  predniSONE (DELTASONE) 20 MG tablet Take 3 tablets (60 mg total) by mouth daily. 02/04/14   Benjiman CorePickering, Nathan, MD    Family History Family History  Problem Relation  Age of Onset  . Diabetes Mother   . Diabetes Father   . Colon cancer Neg Hx   . Stomach cancer Neg Hx     Social History Social History   Tobacco Use  . Smoking status: Former Games developermoker  . Smokeless tobacco: Never Used  Substance Use Topics  . Alcohol use: Yes    Alcohol/week: 6.0 standard drinks    Types: 6 Cans of beer per week    Comment: occ  . Drug use: No     Allergies   Patient has no known allergies.   Review of Systems Review of Systems  Constitutional: Negative for fever.  HENT: Negative for congestion and sore throat.   Eyes: Negative for visual disturbance.  Respiratory: Positive for cough. Negative for chest tightness and shortness of breath.   Cardiovascular: Negative for chest pain.  Gastrointestinal: Negative for abdominal pain, nausea and vomiting.  Genitourinary: Negative for decreased urine volume, difficulty urinating, dysuria and flank pain.  Musculoskeletal: Positive for back pain. Negative for joint swelling, neck pain and neck stiffness.  Skin: Negative for rash.  Neurological: Negative for dizziness, weakness, numbness and headaches.     Physical Exam Updated Vital Signs BP 123/76 (BP Location: Right Arm)   Pulse 71   Temp 97.9 F (36.6 C) (Oral)   Resp 18   SpO2 100%   Physical Exam Vitals signs and nursing note reviewed.  Constitutional:      Appearance: Normal appearance. He is not ill-appearing or toxic-appearing.  HENT:     Head: Atraumatic.     Mouth/Throat:     Mouth: Mucous membranes are moist.     Pharynx: Oropharynx is clear.  Neck:     Musculoskeletal: Normal range of motion. No muscular tenderness.  Cardiovascular:     Rate and Rhythm: Normal rate and regular rhythm.     Pulses: Normal pulses.  Pulmonary:     Effort: Pulmonary effort is normal.     Breath sounds: Normal breath sounds.  Chest:     Chest wall: No tenderness.  Musculoskeletal: Normal range of motion.     Thoracic back: He exhibits tenderness.        Back:     Comments: Midline tenderness of the upper thoracic spine and mild tenderness to the bilateral paraspinal muscles.  Pain is reproducible with palpation and range of motion of the arms.  Skin:    General: Skin is warm.     Capillary Refill: Capillary refill takes less than 2 seconds.     Findings: No erythema or rash.  Neurological:     General: No focal deficit present.     Mental Status: He is alert.     Sensory: Sensation is intact.     Motor: Motor function is intact. No weakness.     Coordination: Coordination is intact.     Comments: CN II-XII grossly intact  Psychiatric:        Mood and Affect: Mood normal.      ED Treatments / Results  Labs (all labs ordered are listed, but only abnormal results are displayed) Labs Reviewed - No data to display  EKG EKG Interpretation  Date/Time:  Wednesday August 23 2018 13:55:42 EDT Ventricular Rate:  59 PR Interval:    QRS Duration: 113 QT Interval:  411 QTC Calculation: 408 R Axis:   0 Text Interpretation:  Sinus rhythm Incomplete right bundle branch block Low voltage, precordial leads ST elevation suggests acute pericarditis Confirmed by Milton Ferguson [id number] on 08/23/2018 2:05:54 PM  EKG reviewed by Dr. Roderic Palau.  Similar to previous tracing.  Doubt pericarditis   Radiology Dg Chest 2 View  Result Date: 08/23/2018 CLINICAL DATA:  Upper back pain. EXAM: CHEST - 2 VIEW COMPARISON:  None. FINDINGS: Mild opacity in left base. The heart, hila, mediastinum, lungs, and pleura are otherwise unremarkable. No cause for back pain identified. IMPRESSION: Probable atelectasis in the left base. No other acute abnormalities are noted. Electronically Signed   By: Dorise Bullion III M.D   On: 08/23/2018 13:59    Procedures Procedures (including critical care time)  Medications Ordered in ED Medications - No data to display   Initial Impression / Assessment and Plan / ED Course  I have reviewed the triage vital signs and the  nursing notes.  Pertinent labs & imaging results that were available during my care of the patient were reviewed by me and considered in my medical decision making (see chart for details).        Patient with upper back pain, likely musculoskeletal.  Neurovascularly intact.  Pain is reproducible with palpation and movement.  No focal neuro deficits.  No concerning symptoms for PE or ACS.  Discussed with Dr. Roderic Palau.  Patient agrees to treatment plan and close outpatient follow-up.  Return precautions were discussed.  Final Clinical Impressions(s) / ED Diagnoses   Final diagnoses:  Upper back pain  ED Discharge Orders    None       Pauline Ausriplett, Eason Housman, Cordelia Poche-C 08/23/18 1427    Bethann BerkshireZammit, Joseph, MD 08/23/18 Silva [id number]

## 2018-11-21 ENCOUNTER — Emergency Department (HOSPITAL_COMMUNITY)
Admission: EM | Admit: 2018-11-21 | Discharge: 2018-11-21 | Disposition: A | Discharge status: Home / Self Care | Payer: Self-pay | Attending: Emergency Medicine | Admitting: Emergency Medicine

## 2018-11-21 ENCOUNTER — Encounter (HOSPITAL_COMMUNITY): Payer: Self-pay

## 2018-11-21 ENCOUNTER — Other Ambulatory Visit: Payer: Self-pay

## 2018-11-21 DIAGNOSIS — Z87891 Personal history of nicotine dependence: Secondary | ICD-10-CM | POA: Insufficient documentation

## 2018-11-21 DIAGNOSIS — B029 Zoster without complications: Secondary | ICD-10-CM | POA: Insufficient documentation

## 2018-11-21 DIAGNOSIS — R52 Pain, unspecified: Secondary | ICD-10-CM | POA: Insufficient documentation

## 2018-11-21 DIAGNOSIS — Z79899 Other long term (current) drug therapy: Secondary | ICD-10-CM | POA: Insufficient documentation

## 2018-11-21 MED ORDER — GABAPENTIN 300 MG PO CAPS: 300.0000 mg | ORAL_CAPSULE | Freq: Three times a day (TID) | ORAL | 0 refills | Status: AC

## 2018-11-21 MED ORDER — IBUPROFEN 800 MG PO TABS: 800.0000 mg | ORAL_TABLET | Freq: Three times a day (TID) | ORAL | 0 refills | Status: AC

## 2018-11-21 NOTE — ED Provider Notes (Signed)
[facility] EMERGENCY DEPARTMENT Provider Note   CSN: [id number] Arrival date & time: 11/21/18  7829     History   Chief Complaint Chief Complaint  Patient presents with  . shingles    HPI Jay Wyatt is a 58 y.o. male with a past medical history significant for Bell's palsy and GERD who presents to the ED due to right-sided abdominal/ chest wall pain overlying his shingles rash that has stayed pretty constant. Patient notes he was diagnosed with shingles 2 weeks ago which was treated with acyclovir for 7 days. Patient states he finished the entire treatment, but still continues to have pain around the site of the rash. Pain does not move from the rash. He notes the pain is worse when he stretches his arm up, takes a deep breath in, or lies directly on the rash. He has tried ibuprofen for pain with mild relief. Patient denies fever, chest pain, shortness of breath, and drainage from rash.   Past Medical History:  Diagnosis Date  . Bell's palsy   . GERD (gastroesophageal reflux disease)     There are no active problems to display for this patient.   Past Surgical History:  Procedure Laterality Date  . no prior surgery          Home Medications    Prior to Admission medications   Medication Sig Start Date End Date Taking? Authorizing Provider  acetaminophen (TYLENOL) 500 MG tablet Take 1,000 mg by mouth every 6 (six) hours as needed for moderate pain.    [provider]  cyclobenzaprine (FLEXERIL) 10 MG tablet Take 1 tablet (10 mg total) by mouth 3 (three) times daily as needed. 08/23/18   Triplett, Tammy, PA-C  gabapentin (NEURONTIN) 300 MG capsule Take 1 capsule (300 mg total) by mouth 3 (three) times daily. Please take 1 tablet on day one, 1 tablet twice on day two, then 1 tablet 3 times a day on day 3. 11/21/18   Cheek, Vesta Mixer, PA-C  ibuprofen (ADVIL) 800 MG tablet Take 1 tablet (800 mg total) by mouth 3 (three) times daily. 11/21/18   Cheek, Vesta Mixer, PA-C   naproxen (NAPROSYN) 500 MG tablet Take 1 tablet (500 mg total) by mouth 2 (two) times daily with a meal. 08/23/18   Triplett, Tammy, PA-C  oxyCODONE-acetaminophen (PERCOCET/ROXICET) 5-325 MG per tablet Take 1 tablet by mouth every 4 (four) hours as needed. Patient not taking: Reported on 02/04/2014 12/22/13   Triplett, Tammy, PA-C  predniSONE (DELTASONE) 20 MG tablet Take 3 tablets (60 mg total) by mouth daily. 02/04/14   Benjiman Core, MD    Family History Family History  Problem Relation Age of Onset  . Diabetes Mother   . Diabetes Father   . Colon cancer Neg Hx   . Stomach cancer Neg Hx     Social History Social History   Tobacco Use  . Smoking status: Former Games developer  . Smokeless tobacco: Never Used  Substance Use Topics  . Alcohol use: Yes    Alcohol/week: 6.0 standard drinks    Types: 6 Cans of beer per week    Comment: occ  . Drug use: No     Allergies   Patient has no known allergies.   Review of Systems Review of Systems  Constitutional: Negative for chills and fever.  Respiratory: Negative for shortness of breath.   Cardiovascular: Negative for chest pain and palpitations.  Skin: Positive for color change and rash.     Physical Exam Updated  Vital Signs BP 123/85 (BP Location: Right Arm)   Pulse 69   Temp 97.9 F (36.6 C) (Oral)   Resp 18   Ht 5\' 5"  (1.651 m)   Wt 81.6 kg   SpO2 92%   BMI 29.95 kg/m   Physical Exam Vitals signs and nursing note reviewed.  Constitutional:      General: He is not in acute distress.    Appearance: He is not ill-appearing.  HENT:     Head: Normocephalic.  Neck:     Musculoskeletal: Normal range of motion and neck supple.  Cardiovascular:     Rate and Rhythm: Normal rate and regular rhythm.     Pulses: Normal pulses.     Heart sounds: Normal heart sounds.  Pulmonary:     Effort: Pulmonary effort is normal.     Breath sounds: Normal breath sounds.  Skin:         Comments: Dermatomal, scabbed over rash on  right-sided chest wall and upper abdomen. No drainage. No erythema. No petechiae. Appears to be healing well.   Neurological:     Mental Status: He is alert.      ED Treatments / Results  Labs (all labs ordered are listed, but only abnormal results are displayed) Labs Reviewed - No data to display  EKG None  Radiology No results found.  Procedures Procedures (including critical care time)  Medications Ordered in ED Medications - No data to display   Initial Impression / Assessment and Plan / ED Course  I have reviewed the triage vital signs and the nursing notes.  Pertinent labs & imaging results that were available during my care of the patient were reviewed by me and considered in my medical decision making (see chart for details).       Jay Wyatt is a 58 year old male who presents to the ED for an evaluation of pain with shingles rash. He was recently diagnosed with shingles, finished full course of antiviral, and still complains of pain. Upon arrival, vitals all within normal limits. On physical exam, patient is in no acute distress and rather well appearing. He has a dermatomal, scabbed over rash on right lower chest wall wrapping over to his flank region. No erythema. No drainage. Rash appears to be healing well. Suspect postherpetic neuralgia. No concern for superimposed infection. Will treat symptomatically with gabapentin and ibuprofen. No history of renal insuffiencey in chart. Patient given number for PCP and advised to follow up with them for continued pain management. Strict ED precautions discussed with patient. Patient states understanding and agrees to plan. Patient discharged home in no acute distress and vitals within normal limits.  Final Clinical Impressions(s) / ED Diagnoses   Final diagnoses:  Herpes zoster without complication    ED Discharge Orders         Ordered    gabapentin (NEURONTIN) 300 MG capsule  3 times daily     11/21/18 0749     ibuprofen (ADVIL) 800 MG tablet  3 times daily     11/21/18 0749           Romie Levee 11/21/18 2706    Noemi Chapel, MD 11/21/18 [phone]

## 2018-11-21 NOTE — ED Provider Notes (Signed)
This patient reports to me that he has had shingles on his right side that was diagnosed between 2 and 3 weeks ago.  He took a course of antivirals but states that he still has pain in fact the pain is radiating from the back to the front.  It does seem to get worse with taking a deep breath worse with movement worse with palpation and even light touch of the skin.  On exam the patient has a scattered scabbed over rash in a dermatomal distribution in the lower thoracic area on the right side extending from the spine all the way to the umbilicus.  The patient has postherpetic neuralgia, will attempt a combination of NSAIDs with gabapentin, patient agreeable to the plan, there is no signs of infection or alternative etiology that would be more likely than this and certainly nothing that requires further work-up with labs or x-ray imaging.  The patient is agreeable to the plan  Medical screening examination/treatment/procedure(s) were conducted as a shared visit with non-physician practitioner(s) and myself.  I personally evaluated the patient during the encounter.  Clinical Impression:   Final diagnoses:  Herpes zoster without complication         Noemi Chapel, MD 11/21/18 [phone]

## 2018-11-21 NOTE — Discharge Instructions (Addendum)
Please take the medication as prescribed for pain. Gabapentin: Take 1 tablet on day 1, then on day 2 take 1 tablet twice, day 3 take 1 tablet 3 times a day. After day 3, take 1 tablet 3 times a day. I have also prescribed ibuprofen. Take as needed for pain. I have provided you a number for a primary care doctor. Please call and schedule an appointment. Return to the ED with new or worsening symptoms.

## 2018-11-21 NOTE — ED Triage Notes (Signed)
Pt reports diagnosed with shingles to r side x 2 weeks.  Reports has been on acyclovir and rash has scabbed over.  Pt says is still having significant pain.

## 2020-03-26 IMAGING — DX CHEST - 2 VIEW
2 series · 2 of 2 positions shown · non-contrast
Comparison: None.

CLINICAL DATA: Upper back pain.

EXAM:
CHEST - 2 VIEW

[chest pa]
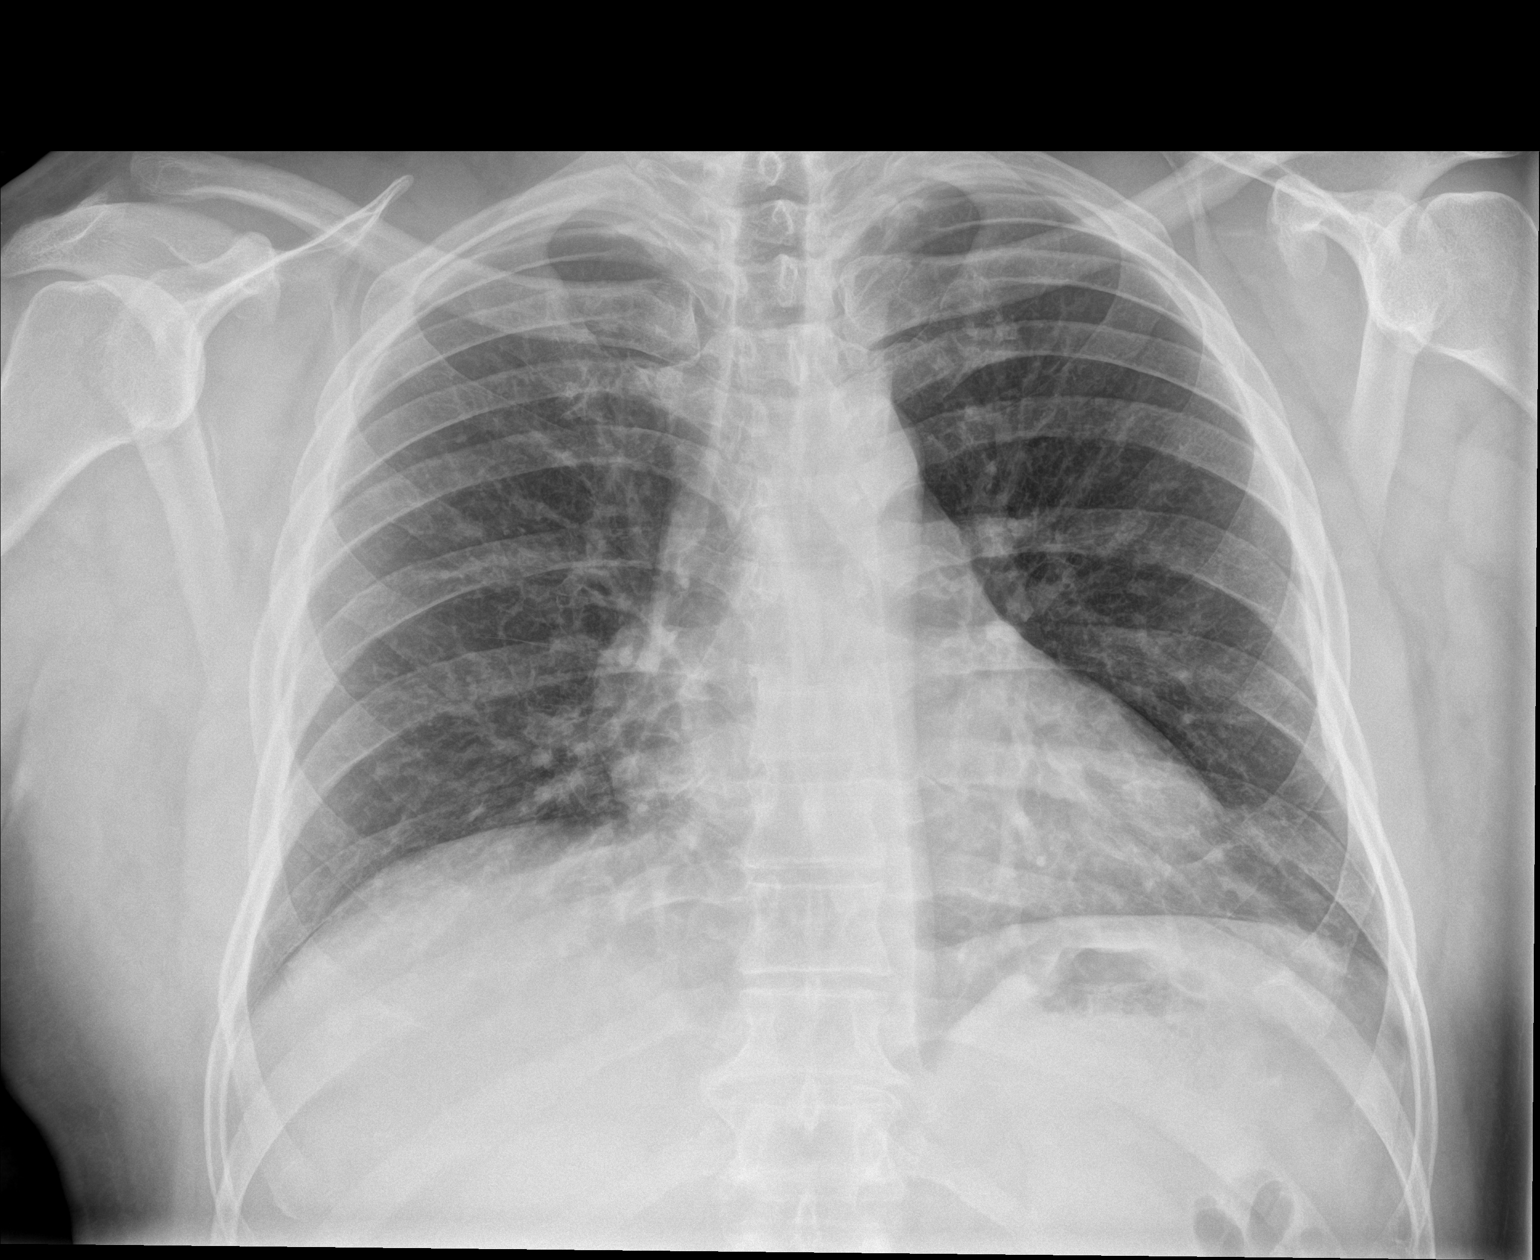

[chest lat]
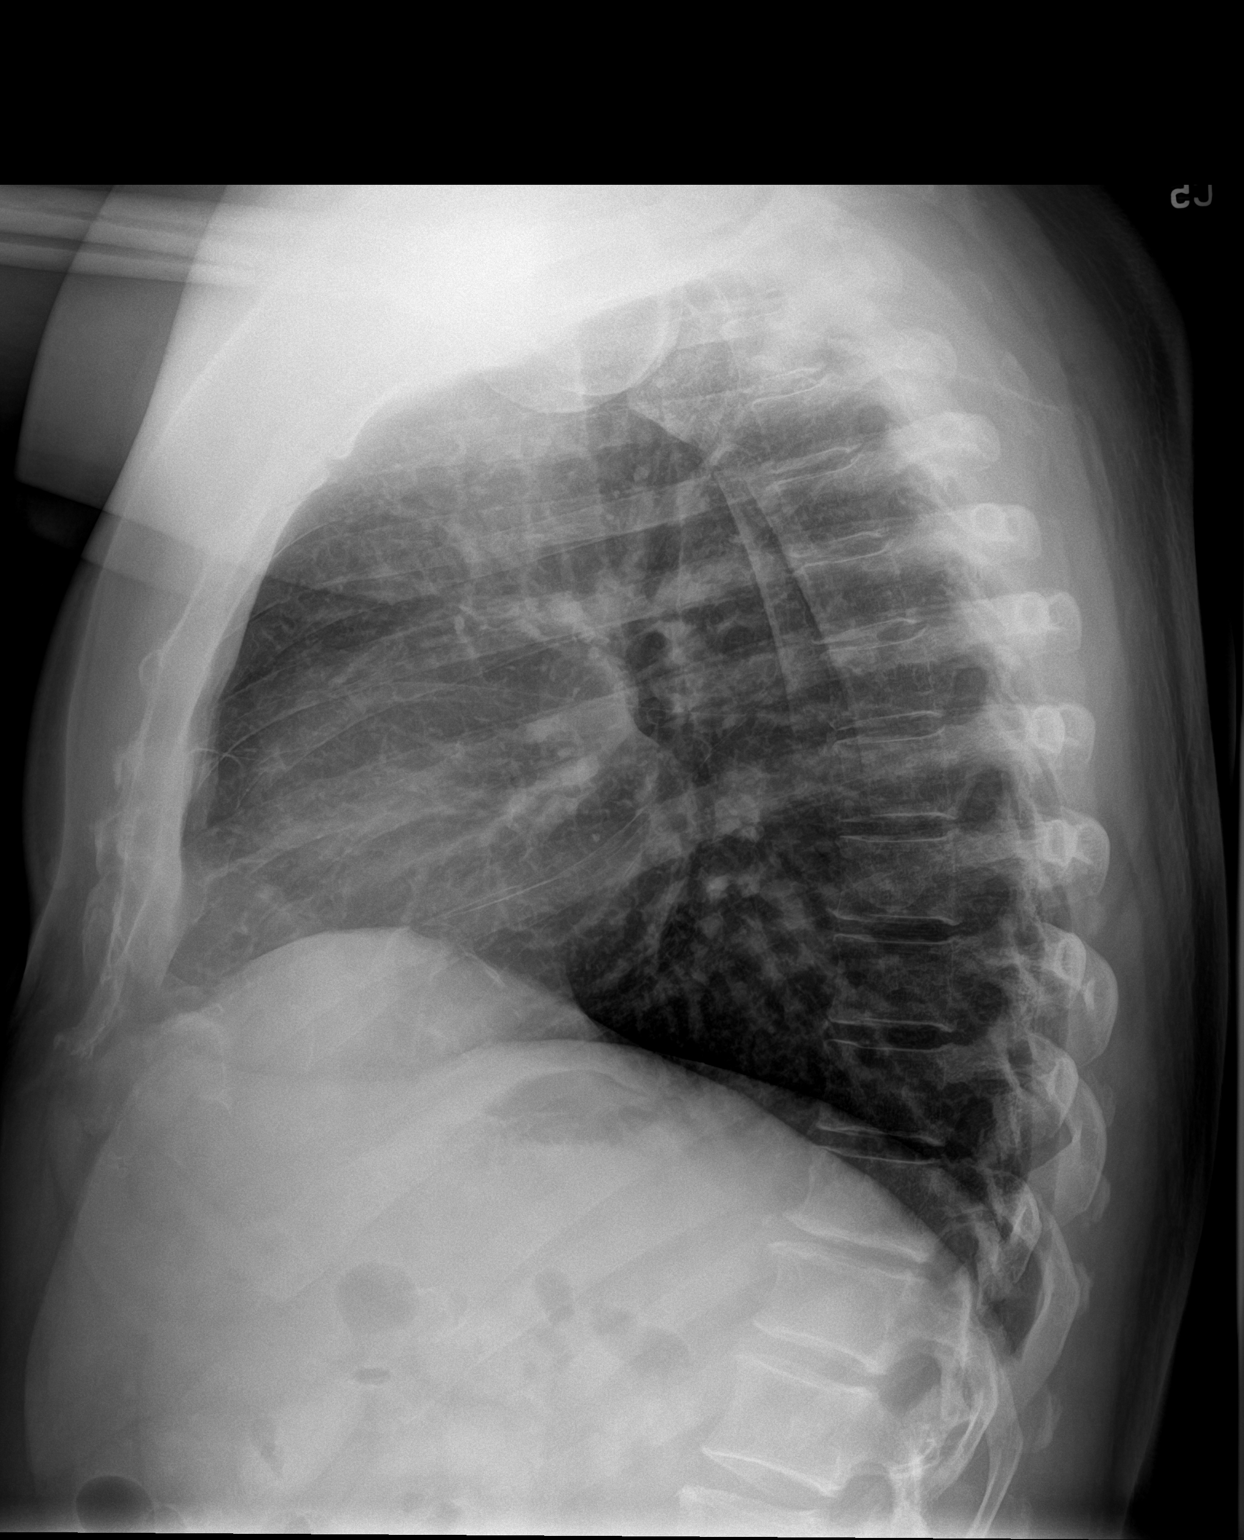

[2 of 2 positions shown; findings below may reference images not displayed]

FINDINGS: Mild opacity in left base. The heart, hila, mediastinum, lungs, and
pleura are otherwise unremarkable. No cause for back pain
identified.
IMPRESSION: Probable atelectasis in the left base. No other acute abnormalities
are noted.
